# Patient Record
Sex: Female | Born: 1990 | Race: Black or African American | Hispanic: No | Marital: Single | Smoking: Never smoker
Health system: Southern US, Community
[De-identification: ages and names within clinical notes are randomized; demographics above are authoritative.]

---

## 2015-08-16 ENCOUNTER — Ambulatory Visit (INDEPENDENT_AMBULATORY_CARE_PROVIDER_SITE_OTHER): Payer: BLUE CROSS/BLUE SHIELD | Admitting: Family Medicine

## 2015-08-16 ENCOUNTER — Ambulatory Visit (INDEPENDENT_AMBULATORY_CARE_PROVIDER_SITE_OTHER): Payer: BLUE CROSS/BLUE SHIELD

## 2015-08-16 VITALS — BP 124/82 | HR 95 | Temp 98.1°F | Resp 18 | Wt 181.0 lb

## 2015-08-16 DIAGNOSIS — M5441 Lumbago with sciatica, right side: Secondary | ICD-10-CM | POA: Diagnosis not present

## 2015-08-16 DIAGNOSIS — M25551 Pain in right hip: Secondary | ICD-10-CM | POA: Diagnosis not present

## 2015-08-16 MED ORDER — MELOXICAM 7.5 MG PO TABS: 7.5000 mg | ORAL_TABLET | Freq: Every day | ORAL | Status: AC

## 2015-08-16 MED ORDER — CYCLOBENZAPRINE HCL 5 MG PO TABS: ORAL_TABLET | ORAL | Status: AC

## 2015-08-16 NOTE — Progress Notes (Addendum)
Subjective:  By signing my name below, I, Jacqueline Hess, attest that this documentation has been prepared under the direction and in the presence of Jacqueline Staggers, MD.  Jacqueline Hess, Medical Scribe. 08/16/2015.  1:57 PM.   Patient ID: Jacqueline Hess, female    DOB: Mar 29, 1991, 25 y.o.   MRN: [id number]  Chief Complaint  Patient presents with  . Hip Pain    today    HPI HPI Comments: Jacqueline Hess is a 25 y.o. female who presents to [facility] complaining of right hip pain, onset today. Pt states that she woke up with sudden inability tomove the hip without pain.  She indicates that she was unable to bear weight on the area only with assistance. She notes that the pain is present in her hip area and mildly radiates to her right side, and down to her upper outer leg area. Pt notes that last night she went out dancing, and reports being intoxicated, however denies blacking out. She states that she returned home experiencing no symptoms, and she slept in her bed following her regular regimen. Pt was sleeping on her right side during the night. Pt has not taken any medications for the pain, and notes not applying any cold or heat on the area. She denies back pain, radiation of the pain to the genitals, bowel or urinary incontinence, saddle anesthesia, weakness down the legs, fever, night sweats, cold symptoms, or rash.   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 Pt is originally from [place].    There are no active problems to display for this patient.  No past medical history on file. No past surgical history on file. Allergies  Allergen Reactions  . Penicillins    Prior to Admission medications   Not on File   Social History   Social History  . Marital Status: Single    Spouse Name: N/A  . Number of Children: N/A  . Years of Education: N/A   Occupational History  . Not on file.   Social History Main Topics  . Smoking status: Never  Smoker   . Smokeless tobacco: Not on file  . Alcohol Use: No  . Drug Use: No  . Sexual Activity: Not on file   Other Topics Concern  . Not on file   Social History Narrative  . No narrative on file    Review of Systems  Constitutional: Negative for fever and diaphoresis.  Gastrointestinal: Negative for diarrhea.  Genitourinary: Negative for enuresis.  Musculoskeletal: Positive for myalgias and arthralgias. Negative for back pain.  Skin: Negative for rash.  Neurological: Negative for weakness.      Objective:   Physical Exam  Constitutional: She is oriented to person, place, and time. She appears well-developed and well-nourished. No distress.  HENT:  Head: Normocephalic and atraumatic.  Eyes: EOM are normal. Pupils are equal, round, and reactive to light.  Neck: Neck supple.  Cardiovascular: Normal rate.   Pulmonary/Chest: Effort normal.  Musculoskeletal: She exhibits tenderness.  Guarded, with no ability to bear weight on the right leg.Tender along the right sciatic notch. SI and lumbar is non tender. Radiation of pain down the right lateral posterior leg. Pain on the lateral hip and leg with internal rotation of the him. Minimal discomfort with external a rotation of the hip. Negative seated straight leg raise.   Neurological: She is alert and oriented to person, place, and time. No cranial nerve deficit. She displays no  Babinski's sign on the right side. She displays no Babinski's sign on the left side.  Reflex Scores:      Patellar reflexes are 2+ on the right side and 2+ on the left side.      Achilles reflexes are 2+ on the right side and 2+ on the left side. Skin: Skin is warm and dry.  Psychiatric: She has a normal mood and affect. Her behavior is normal.  Nursing note and vitals reviewed.   Filed Vitals:   08/16/15 1316  BP: 124/82  Pulse: 95  Temp: 98.1 F (36.7 C)  TempSrc: Oral  Resp: 18  Weight: 181 lb (82.101 kg)  SpO2: 98%   Dg Hip Unilat W Or W/o  Pelvis 2-3 Views Right  08/16/2015  CLINICAL DATA:  25 year old with acute onset of severe right hip pain when she slowly this morning. No known injury. EXAM: DG HIP (WITH OR WITHOUT PELVIS) 1V RIGHT COMPARISON:  None. FINDINGS: No evidence of acute fracture or dislocation. Well preserved joint space. Well preserved bone mineral density. No intrinsic osseous abnormality. Included AP pelvis demonstrates a normal-appearing contralateral left hip. Sacroiliac joints and symphysis pubis intact. Visualized lower lumbar spine unremarkable. IMPRESSION: Normal examination. Electronically Signed   By: Jacqueline Hess M.D.   On: 08/16/2015 15:21        Assessment & Plan:   Jacqueline Hess is a 25 y.o. female Right hip pain - Plan: DG HIP UNILAT W OR W/O PELVIS 2-3 VIEWS RIGHT, Crutches  Right-sided low back pain with right-sided sciatica - Plan: DG Lumbar Spine 2-3 Views, meloxicam (MOBIC) 7.5 MG tablet, cyclobenzaprine (FLEXERIL) 5 MG tablet, Crutches  No known injury, , possible early sciatica with radiation to hip/leg.    -crutches as difficulty with WB  - mobic, flexeril if needed  - SED  - handout on sciatica treatment, relative rest, but if still unable to WB in 2-3 days, recommend ortho eval - can call to arrange this through [facility].   -RTC/ER precautions.   Meds ordered this encounter  Medications  . meloxicam (MOBIC) 7.5 MG tablet    Sig: Take 1 tablet (7.5 mg total) by mouth daily.    Dispense:  30 tablet    Refill:  0  . cyclobenzaprine (FLEXERIL) 5 MG tablet    Sig: 1 pill by mouth up to every 8 hours as needed. Start with one pill by mouth each bedtime as needed due to sedation    Dispense:  15 tablet    Refill:  0   Patient Instructions  You likely have sciatica or back issue, which can lead to some muscle spasm as well as pain down the leg. Try the mobic each morning (do not combine with other over the counter pain relievers), flexeril at night if needed. Crutches as  needed, if still not able to put weight on your right leg Monday or Tuesday, recommend you follow-up with an orthopedist. Let me know if I need to place a referral.Return to the clinic or go to the nearest emergency room if any of your symptoms worsen or new symptoms occur.  Sciatica With Rehab The sciatic nerve runs from the back down the leg and is responsible for sensation and control of the muscles in the back (posterior) side of the thigh, lower leg, and foot. Sciatica is a condition that is characterized by inflammation of this nerve.  SYMPTOMS   Signs of nerve damage, including numbness and/or weakness along the posterior side of  the lower extremity.  Pain in the back of the thigh that may also travel down the leg.  Pain that worsens when sitting for long periods of time.  Occasionally, pain in the back or buttock. CAUSES  Inflammation of the sciatic nerve is the cause of sciatica. The inflammation is due to something irritating the nerve. Common sources of irritation include: 1. Sitting for long periods of time. 2. Direct trauma to the nerve. 3. Arthritis of the spine. 4. Herniated or ruptured disk. 5. Slipping of the vertebrae (spondylolisthesis). 6. Pressure from soft tissues, such as muscles or ligament-like tissue (fascia). RISK INCREASES WITH: 1. Sports that place pressure or stress on the spine (football or weightlifting). 2. Poor strength and flexibility. 3. Failure to warm up properly before activity. 4. Family history of low back pain or disk disorders. 5. Previous back injury or surgery. 6. Poor body mechanics, especially when lifting, or poor posture. PREVENTION  1. Warm up and stretch properly before activity. 2. Maintain physical fitness: 1. Strength, flexibility, and endurance. 3. Cardiovascular fitness. 4. Learn and use proper technique, especially with posture and lifting. When possible, have coach correct improper technique. 5. Avoid activities that place  stress on the spine. PROGNOSIS If treated properly, then sciatica usually resolves within 6 weeks. However, occasionally surgery is necessary.  RELATED COMPLICATIONS  1. Permanent nerve damage, including pain, numbness, tingle, or weakness. 2. Chronic back pain. 3. Risks of surgery: infection, bleeding, nerve damage, or damage to surrounding tissues. TREATMENT Treatment initially involves resting from any activities that aggravate your symptoms. The use of ice and medication may help reduce pain and inflammation. The use of strengthening and stretching exercises may help reduce pain with activity. These exercises may be performed at home or with referral to a therapist. A therapist may recommend further treatments, such as transcutaneous electronic nerve stimulation (TENS) or ultrasound. Your caregiver may recommend corticosteroid injections to help reduce inflammation of the sciatic nerve. If symptoms persist despite non-surgical (conservative) treatment, then surgery may be recommended. MEDICATION 1. If pain medication is necessary, then nonsteroidal anti-inflammatory medications, such as aspirin and ibuprofen, or other minor pain relievers, such as acetaminophen, are often recommended. 2. Do not take pain medication for 7 days before surgery. 3. Prescription pain relievers may be given if deemed necessary by your caregiver. Use only as directed and only as much as you need. 4. Ointments applied to the skin may be helpful. 5. Corticosteroid injections may be given by your caregiver. These injections should be reserved for the most serious cases, because they may only be given a certain number of times. HEAT AND COLD 1. Cold treatment (icing) relieves pain and reduces inflammation. Cold treatment should be applied for 10 to 15 minutes every 2 to 3 hours for inflammation and pain and immediately after any activity that aggravates your symptoms. Use ice packs or massage the area with a piece of ice  (ice massage). 2. Heat treatment may be used prior to performing the stretching and strengthening activities prescribed by your caregiver, physical therapist, or athletic trainer. Use a heat pack or soak the injury in warm water. SEEK MEDICAL CARE IF: 1. Treatment seems to offer no benefit, or the condition worsens. 2. Any medications produce adverse side effects. EXERCISES  RANGE OF MOTION (ROM) AND STRETCHING EXERCISES - Sciatica Most people with sciatic will find that their symptoms worsen with either excessive bending forward (flexion) or arching at the low back (extension). The exercises which will help resolve your  symptoms will focus on the opposite motion. Your physician, physical therapist or athletic trainer will help you determine which exercises will be most helpful to resolve your low back pain. Do not complete any exercises without first consulting with your clinician. Discontinue any exercises which worsen your symptoms until you speak to your clinician. If you have pain, numbness or tingling which travels down into your buttocks, leg or foot, the goal of the therapy is for these symptoms to move closer to your back and eventually resolve. Occasionally, these leg symptoms will get better, but your low back pain may worsen; this is typically an indication of progress in your rehabilitation. Be certain to be very alert to any changes in your symptoms and the activities in which you participated in the 24 hours prior to the change. Sharing this information with your clinician will allow him/her to most efficiently treat your condition. These exercises may help you when beginning to rehabilitate your injury. Your symptoms may resolve with or without further involvement from your physician, physical therapist or athletic trainer. While completing these exercises, remember:   Restoring tissue flexibility helps normal motion to return to the joints. This allows healthier, less painful movement and  activity.  An effective stretch should be held for at least 30 seconds.  A stretch should never be painful. You should only feel a gentle lengthening or release in the stretched tissue. FLEXION RANGE OF MOTION AND STRETCHING EXERCISES: STRETCH - Flexion, Single Knee to Chest   Lie on a firm bed or floor with both legs extended in front of you.  Keeping one leg in contact with the floor, bring your opposite knee to your chest. Hold your leg in place by either grabbing behind your thigh or at your knee.  Pull until you feel a gentle stretch in your low back. Hold __________ seconds.  Slowly release your grasp and repeat the exercise with the opposite side. Repeat __________ times. Complete this exercise __________ times per day.  STRETCH - Flexion, Double Knee to Chest  Lie on a firm bed or floor with both legs extended in front of you.  Keeping one leg in contact with the floor, bring your opposite knee to your chest.  Tense your stomach muscles to support your back and then lift your other knee to your chest. Hold your legs in place by either grabbing behind your thighs or at your knees.  Pull both knees toward your chest until you feel a gentle stretch in your low back. Hold __________ seconds.  Tense your stomach muscles and slowly return one leg at a time to the floor. Repeat __________ times. Complete this exercise __________ times per day.  STRETCH - Low Trunk Rotation   Lie on a firm bed or floor. Keeping your legs in front of you, bend your knees so they are both pointed toward the ceiling and your feet are flat on the floor.  Extend your arms out to the side. This will stabilize your upper body by keeping your shoulders in contact with the floor.  Gently and slowly drop both knees together to one side until you feel a gentle stretch in your low back. Hold for __________ seconds.  Tense your stomach muscles to support your low back as you bring your knees back to the  starting position. Repeat the exercise to the other side. Repeat __________ times. Complete this exercise __________ times per day  EXTENSION RANGE OF MOTION AND FLEXIBILITY EXERCISES: STRETCH - Extension, Prone on  Elbows  Lie on your stomach on the floor, a bed will be too soft. Place your palms about shoulder width apart and at the height of your head.  Place your elbows under your shoulders. If this is too painful, stack pillows under your chest.  Allow your body to relax so that your hips drop lower and make contact more completely with the floor.  Hold this position for __________ seconds.  Slowly return to lying flat on the floor. Repeat __________ times. Complete this exercise __________ times per day.  RANGE OF MOTION - Extension, Prone Press Ups  Lie on your stomach on the floor, a bed will be too soft. Place your palms about shoulder width apart and at the height of your head.  Keeping your back as relaxed as possible, slowly straighten your elbows while keeping your hips on the floor. You may adjust the placement of your hands to maximize your comfort. As you gain motion, your hands will come more underneath your shoulders.  Hold this position __________ seconds.  Slowly return to lying flat on the floor. Repeat __________ times. Complete this exercise __________ times per day.  STRENGTHENING EXERCISES - Sciatica  These exercises may help you when beginning to rehabilitate your injury. These exercises should be done near your "sweet spot." This is the neutral, low-back arch, somewhere between fully rounded and fully arched, that is your least painful position. When performed in this safe range of motion, these exercises can be used for people who have either a flexion or extension based injury. These exercises may resolve your symptoms with or without further involvement from your physician, physical therapist or athletic trainer. While completing these exercises, remember:    Muscles can gain both the endurance and the strength needed for everyday activities through controlled exercises.  Complete these exercises as instructed by your physician, physical therapist or athletic trainer. Progress with the resistance and repetition exercises only as your caregiver advises.  You may experience muscle soreness or fatigue, but the pain or discomfort you are trying to eliminate should never worsen during these exercises. If this pain does worsen, stop and make certain you are following the directions exactly. If the pain is still present after adjustments, discontinue the exercise until you can discuss the trouble with your clinician. STRENGTHENING - Deep Abdominals, Pelvic Tilt   Lie on a firm bed or floor. Keeping your legs in front of you, bend your knees so they are both pointed toward the ceiling and your feet are flat on the floor.  Tense your lower abdominal muscles to press your low back into the floor. This motion will rotate your pelvis so that your tail bone is scooping upwards rather than pointing at your feet or into the floor.  With a gentle tension and even breathing, hold this position for __________ seconds. Repeat __________ times. Complete this exercise __________ times per day.  STRENGTHENING - Abdominals, Crunches   Lie on a firm bed or floor. Keeping your legs in front of you, bend your knees so they are both pointed toward the ceiling and your feet are flat on the floor. Cross your arms over your chest.  Slightly tip your chin down without bending your neck.  Tense your abdominals and slowly lift your trunk high enough to just clear your shoulder blades. Lifting higher can put excessive stress on the low back and does not further strengthen your abdominal muscles.  Control your return to the starting position. Repeat __________ times. Complete  this exercise __________ times per day.  STRENGTHENING - Quadruped, Opposite UE/LE Lift  Assume a  hands and knees position on a firm surface. Keep your hands under your shoulders and your knees under your hips. You may place padding under your knees for comfort.  Find your neutral spine and gently tense your abdominal muscles so that you can maintain this position. Your shoulders and hips should form a rectangle that is parallel with the floor and is not twisted.  Keeping your trunk steady, lift your right hand no higher than your shoulder and then your left leg no higher than your hip. Make sure you are not holding your breath. Hold this position __________ seconds.  Continuing to keep your abdominal muscles tense and your back steady, slowly return to your starting position. Repeat with the opposite arm and leg. Repeat __________ times. Complete this exercise __________ times per day.  STRENGTHENING - Abdominals and Quadriceps, Straight Leg Raise   Lie on a firm bed or floor with both legs extended in front of you.  Keeping one leg in contact with the floor, bend the other knee so that your foot can rest flat on the floor.  Find your neutral spine, and tense your abdominal muscles to maintain your spinal position throughout the exercise.  Slowly lift your straight leg off the floor about 6 inches for a count of 15, making sure to not hold your breath.  Still keeping your neutral spine, slowly lower your leg all the way to the floor. Repeat this exercise with each leg __________ times. Complete this exercise __________ times per day. POSTURE AND BODY MECHANICS CONSIDERATIONS - Sciatica Keeping correct posture when sitting, standing or completing your activities will reduce the stress put on different body tissues, allowing injured tissues a chance to heal and limiting painful experiences. The following are general guidelines for improved posture. Your physician or physical therapist will provide you with any instructions specific to your needs. While reading these guidelines,  remember:  The exercises prescribed by your provider will help you have the flexibility and strength to maintain correct postures.  The correct posture provides the optimal environment for your joints to work. All of your joints have less wear and tear when properly supported by a spine with good posture. This means you will experience a healthier, less painful body.  Correct posture must be practiced with all of your activities, especially prolonged sitting and standing. Correct posture is as important when doing repetitive low-stress activities (typing) as it is when doing a single heavy-load activity (lifting). RESTING POSITIONS Consider which positions are most painful for you when choosing a resting position. If you have pain with flexion-based activities (sitting, bending, stooping, squatting), choose a position that allows you to rest in a less flexed posture. You would want to avoid curling into a fetal position on your side. If your pain worsens with extension-based activities (prolonged standing, working overhead), avoid resting in an extended position such as sleeping on your stomach. Most people will find more comfort when they rest with their spine in a more neutral position, neither too rounded nor too arched. Lying on a non-sagging bed on your side with a pillow between your knees, or on your back with a pillow under your knees will often provide some relief. Keep in mind, being in any one position for a prolonged period of time, no matter how correct your posture, can still lead to stiffness. PROPER SITTING POSTURE In order to minimize stress and  discomfort on your spine, you must sit with correct posture Sitting with good posture should be effortless for a healthy body. Returning to good posture is a gradual process. Many people can work toward this most comfortably by using various supports until they have the flexibility and strength to maintain this posture on their own. When sitting  with proper posture, your ears will fall over your shoulders and your shoulders will fall over your hips. You should use the back of the chair to support your upper back. Your low back will be in a neutral position, just slightly arched. You may place a small pillow or folded towel at the base of your low back for support.  When working at a desk, create an environment that supports good, upright posture. Without extra support, muscles fatigue and lead to excessive strain on joints and other tissues. Keep these recommendations in mind: CHAIR:   A chair should be able to slide under your desk when your back makes contact with the back of the chair. This allows you to work closely.  The chair's height should allow your eyes to be level with the upper part of your monitor and your hands to be slightly lower than your elbows. BODY POSITION  Your feet should make contact with the floor. If this is not possible, use a foot rest.  Keep your ears over your shoulders. This will reduce stress on your neck and low back. INCORRECT SITTING POSTURES   If you are feeling tired and unable to assume a healthy sitting posture, do not slouch or slump. This puts excessive strain on your back tissues, causing more damage and pain. Healthier options include:  Using more support, like a lumbar pillow.  Switching tasks to something that requires you to be upright or walking.  Talking a brief walk.  Lying down to rest in a neutral-spine position. PROLONGED STANDING WHILE SLIGHTLY LEANING FORWARD  When completing a task that requires you to lean forward while standing in one place for a long time, place either foot up on a stationary 2-4 inch high object to help maintain the best posture. When both feet are on the ground, the low back tends to lose its slight inward curve. If this curve flattens (or becomes too large), then the back and your other joints will experience too much stress, fatigue more quickly and can  cause pain.  CORRECT STANDING POSTURES Proper standing posture should be assumed with all daily activities, even if they only take a few moments, like when brushing your teeth. As in sitting, your ears should fall over your shoulders and your shoulders should fall over your hips. You should keep a slight tension in your abdominal muscles to brace your spine. Your tailbone should point down to the ground, not behind your body, resulting in an over-extended swayback posture.  INCORRECT STANDING POSTURES  Common incorrect standing postures include a forward head, locked knees and/or an excessive swayback. WALKING Walk with an upright posture. Your ears, shoulders and hips should all line-up. PROLONGED ACTIVITY IN A FLEXED POSITION When completing a task that requires you to bend forward at your waist or lean over a low surface, try to find a way to stabilize 3 of 4 of your limbs. You can place a hand or elbow on your thigh or rest a knee on the surface you are reaching across. This will provide you more stability so that your muscles do not fatigue as quickly. By keeping your knees relaxed, or  slightly bent, you will also reduce stress across your low back. CORRECT LIFTING TECHNIQUES DO :   Assume a wide stance. This will provide you more stability and the opportunity to get as close as possible to the object which you are lifting.  Tense your abdominals to brace your spine; then bend at the knees and hips. Keeping your back locked in a neutral-spine position, lift using your leg muscles. Lift with your legs, keeping your back straight.  Test the weight of unknown objects before attempting to lift them.  Try to keep your elbows locked down at your sides in order get the best strength from your shoulders when carrying an object.  Always ask for help when lifting heavy or awkward objects. INCORRECT LIFTING TECHNIQUES DO NOT:   Lock your knees when lifting, even if it is a small object.  Bend and  twist. Pivot at your feet or move your feet when needing to change directions.  Assume that you cannot safely pick up a paperclip without proper posture.   This information is not intended to replace advice given to you by your health care provider. Make sure you discuss any questions you have with your health care provider.   Document Released: 06/21/2005 Document Revised: 11/05/2014 Document Reviewed: 10/03/2008 [employer] Interactive Patient Education [employer] Because you received an x-ray today, you will receive an invoice from [facility]. Please contact [facility] at [phone] with questions or concerns regarding your invoice. Our billing staff will not be able to assist you with those questions.  Crutch Use Crutches are used to take weight off one of your legs or feet when you stand or walk. It is important to use crutches that fit properly. When fitted properly:  Each crutch should be 2-3 finger widths below the armpit.  Your weight should be supported by your hand, and not by resting the armpit on the crutch. RISKS AND COMPLICATIONS Damage to the nerves that extend from your armpit to your hand and arm. To prevent this from happening, make sure your crutches fit properly and do not put pressure on your armpit when using them. HOW TO USE YOUR CRUTCHES If you have been instructed to use partial weight bearing, apply (bear) the amount of weight as your health care provider suggests. Do not bear weight in an amount that causes pain to the area of injury. Walking 7. Step with the crutches. 8. Swing the healthy leg slightly ahead of the crutches. Going Up Steps If there is no handrail: 7. Step up with the healthy leg. 8. Step up with the crutches and injured leg. 9. Continue in this way. If there is a handrail: 6. Hold both crutches in one hand. 7. Place your free hand on the handrail. 8. While putting your weight on your arms, lift your healthy leg to  the step. 9. Bring the crutches and the injured leg up to that step. 10. Continue in this way. Going Down Steps Be very careful, as going down stairs with crutches is very challenging. If there is no handrail: 4. Step down with the injured leg and crutches. 5. Step down with the healthy leg. If there is a handrail: 6. Place your hand on the handrail. 7. Hold both crutches with your free hand. 8. Lower your injured leg and crutch to the step below you. Make sure to keep the crutch tips in the center of the step, never on the edge. 9. Lower your healthy leg to that step.  10. Continue in this way. Standing Up 3. Hold the injured leg forward. 4. Grab the armrest with one hand and the top of the crutches with the other hand. 5. Using these supports, pull yourself up to a standing position. Sitting Down 3. Hold the injured leg forward. 4. Grab the armrest with one hand and the top of the crutches with the other hand. 5. Lower yourself to a sitting position. SEEK MEDICAL CARE IF:  You still feel unsteady on your feet.  You develop new pain, for example in your armpits, back, shoulder, wrist, or hip.  You develop any numbness or tingling. SEEK IMMEDIATE MEDICAL CARE IF:  You fall.   This information is not intended to replace advice given to you by your health care provider. Make sure you discuss any questions you have with your health care provider.   Document Released: 06/18/2000 Document Revised: 07/12/2014 Document Reviewed: 02/26/2013 [employer] Interactive Patient Education [employer]       I personally performed the services described in this documentation, which was scribed in my presence. The recorded information has been reviewed and considered, and addended by me as needed.

## 2015-08-16 NOTE — Patient Instructions (Addendum)
You likely have sciatica or back issue, which can lead to some muscle spasm as well as pain down the leg. Try the mobic each morning (do not combine with other over the counter pain relievers), flexeril at night if needed. Crutches as needed, if still not able to put weight on your right leg Monday or Tuesday, recommend you follow-up with an orthopedist. Let me know if I need to place a referral.Return to the clinic or go to the nearest emergency room if any of your symptoms worsen or new symptoms occur.  Sciatica With Rehab The sciatic nerve runs from the back down the leg and is responsible for sensation and control of the muscles in the back (posterior) side of the thigh, lower leg, and foot. Sciatica is a condition that is characterized by inflammation of this nerve.  SYMPTOMS   Signs of nerve damage, including numbness and/or weakness along the posterior side of the lower extremity.  Pain in the back of the thigh that may also travel down the leg.  Pain that worsens when sitting for long periods of time.  Occasionally, pain in the back or buttock. CAUSES  Inflammation of the sciatic nerve is the cause of sciatica. The inflammation is due to something irritating the nerve. Common sources of irritation include: 1. Sitting for long periods of time. 2. Direct trauma to the nerve. 3. Arthritis of the spine. 4. Herniated or ruptured disk. 5. Slipping of the vertebrae (spondylolisthesis). 6. Pressure from soft tissues, such as muscles or ligament-like tissue (fascia). RISK INCREASES WITH: 1. Sports that place pressure or stress on the spine (football or weightlifting). 2. Poor strength and flexibility. 3. Failure to warm up properly before activity. 4. Family history of low back pain or disk disorders. 5. Previous back injury or surgery. 6. Poor body mechanics, especially when lifting, or poor posture. PREVENTION  1. Warm up and stretch properly before activity. 2. Maintain physical  fitness: 1. Strength, flexibility, and endurance. 3. Cardiovascular fitness. 4. Learn and use proper technique, especially with posture and lifting. When possible, have coach correct improper technique. 5. Avoid activities that place stress on the spine. PROGNOSIS If treated properly, then sciatica usually resolves within 6 weeks. However, occasionally surgery is necessary.  RELATED COMPLICATIONS  1. Permanent nerve damage, including pain, numbness, tingle, or weakness. 2. Chronic back pain. 3. Risks of surgery: infection, bleeding, nerve damage, or damage to surrounding tissues. TREATMENT Treatment initially involves resting from any activities that aggravate your symptoms. The use of ice and medication may help reduce pain and inflammation. The use of strengthening and stretching exercises may help reduce pain with activity. These exercises may be performed at home or with referral to a therapist. A therapist may recommend further treatments, such as transcutaneous electronic nerve stimulation (TENS) or ultrasound. Your caregiver may recommend corticosteroid injections to help reduce inflammation of the sciatic nerve. If symptoms persist despite non-surgical (conservative) treatment, then surgery may be recommended. MEDICATION 1. If pain medication is necessary, then nonsteroidal anti-inflammatory medications, such as aspirin and ibuprofen, or other minor pain relievers, such as acetaminophen, are often recommended. 2. Do not take pain medication for 7 days before surgery. 3. Prescription pain relievers may be given if deemed necessary by your caregiver. Use only as directed and only as much as you need. 4. Ointments applied to the skin may be helpful. 5. Corticosteroid injections may be given by your caregiver. These injections should be reserved for the most serious cases, because they may only  be given a certain number of times. HEAT AND COLD 1. Cold treatment (icing) relieves pain and  reduces inflammation. Cold treatment should be applied for 10 to 15 minutes every 2 to 3 hours for inflammation and pain and immediately after any activity that aggravates your symptoms. Use ice packs or massage the area with a piece of ice (ice massage). 2. Heat treatment may be used prior to performing the stretching and strengthening activities prescribed by your caregiver, physical therapist, or athletic trainer. Use a heat pack or soak the injury in warm water. SEEK MEDICAL CARE IF: 1. Treatment seems to offer no benefit, or the condition worsens. 2. Any medications produce adverse side effects. EXERCISES  RANGE OF MOTION (ROM) AND STRETCHING EXERCISES - Sciatica Most people with sciatic will find that their symptoms worsen with either excessive bending forward (flexion) or arching at the low back (extension). The exercises which will help resolve your symptoms will focus on the opposite motion. Your physician, physical therapist or athletic trainer will help you determine which exercises will be most helpful to resolve your low back pain. Do not complete any exercises without first consulting with your clinician. Discontinue any exercises which worsen your symptoms until you speak to your clinician. If you have pain, numbness or tingling which travels down into your buttocks, leg or foot, the goal of the therapy is for these symptoms to move closer to your back and eventually resolve. Occasionally, these leg symptoms will get better, but your low back pain may worsen; this is typically an indication of progress in your rehabilitation. Be certain to be very alert to any changes in your symptoms and the activities in which you participated in the 24 hours prior to the change. Sharing this information with your clinician will allow him/her to most efficiently treat your condition. These exercises may help you when beginning to rehabilitate your injury. Your symptoms may resolve with or without further  involvement from your physician, physical therapist or athletic trainer. While completing these exercises, remember:   Restoring tissue flexibility helps normal motion to return to the joints. This allows healthier, less painful movement and activity.  An effective stretch should be held for at least 30 seconds.  A stretch should never be painful. You should only feel a gentle lengthening or release in the stretched tissue. FLEXION RANGE OF MOTION AND STRETCHING EXERCISES: STRETCH - Flexion, Single Knee to Chest   Lie on a firm bed or floor with both legs extended in front of you.  Keeping one leg in contact with the floor, bring your opposite knee to your chest. Hold your leg in place by either grabbing behind your thigh or at your knee.  Pull until you feel a gentle stretch in your low back. Hold __________ seconds.  Slowly release your grasp and repeat the exercise with the opposite side. Repeat __________ times. Complete this exercise __________ times per day.  STRETCH - Flexion, Double Knee to Chest  Lie on a firm bed or floor with both legs extended in front of you.  Keeping one leg in contact with the floor, bring your opposite knee to your chest.  Tense your stomach muscles to support your back and then lift your other knee to your chest. Hold your legs in place by either grabbing behind your thighs or at your knees.  Pull both knees toward your chest until you feel a gentle stretch in your low back. Hold __________ seconds.  Tense your stomach muscles and slowly  return one leg at a time to the floor. Repeat __________ times. Complete this exercise __________ times per day.  STRETCH - Low Trunk Rotation   Lie on a firm bed or floor. Keeping your legs in front of you, bend your knees so they are both pointed toward the ceiling and your feet are flat on the floor.  Extend your arms out to the side. This will stabilize your upper body by keeping your shoulders in contact with  the floor.  Gently and slowly drop both knees together to one side until you feel a gentle stretch in your low back. Hold for __________ seconds.  Tense your stomach muscles to support your low back as you bring your knees back to the starting position. Repeat the exercise to the other side. Repeat __________ times. Complete this exercise __________ times per day  EXTENSION RANGE OF MOTION AND FLEXIBILITY EXERCISES: STRETCH - Extension, Prone on Elbows  Lie on your stomach on the floor, a bed will be too soft. Place your palms about shoulder width apart and at the height of your head.  Place your elbows under your shoulders. If this is too painful, stack pillows under your chest.  Allow your body to relax so that your hips drop lower and make contact more completely with the floor.  Hold this position for __________ seconds.  Slowly return to lying flat on the floor. Repeat __________ times. Complete this exercise __________ times per day.  RANGE OF MOTION - Extension, Prone Press Ups  Lie on your stomach on the floor, a bed will be too soft. Place your palms about shoulder width apart and at the height of your head.  Keeping your back as relaxed as possible, slowly straighten your elbows while keeping your hips on the floor. You may adjust the placement of your hands to maximize your comfort. As you gain motion, your hands will come more underneath your shoulders.  Hold this position __________ seconds.  Slowly return to lying flat on the floor. Repeat __________ times. Complete this exercise __________ times per day.  STRENGTHENING EXERCISES - Sciatica  These exercises may help you when beginning to rehabilitate your injury. These exercises should be done near your "sweet spot." This is the neutral, low-back arch, somewhere between fully rounded and fully arched, that is your least painful position. When performed in this safe range of motion, these exercises can be used for people  who have either a flexion or extension based injury. These exercises may resolve your symptoms with or without further involvement from your physician, physical therapist or athletic trainer. While completing these exercises, remember:   Muscles can gain both the endurance and the strength needed for everyday activities through controlled exercises.  Complete these exercises as instructed by your physician, physical therapist or athletic trainer. Progress with the resistance and repetition exercises only as your caregiver advises.  You may experience muscle soreness or fatigue, but the pain or discomfort you are trying to eliminate should never worsen during these exercises. If this pain does worsen, stop and make certain you are following the directions exactly. If the pain is still present after adjustments, discontinue the exercise until you can discuss the trouble with your clinician. STRENGTHENING - Deep Abdominals, Pelvic Tilt   Lie on a firm bed or floor. Keeping your legs in front of you, bend your knees so they are both pointed toward the ceiling and your feet are flat on the floor.  Tense your lower abdominal muscles  to press your low back into the floor. This motion will rotate your pelvis so that your tail bone is scooping upwards rather than pointing at your feet or into the floor.  With a gentle tension and even breathing, hold this position for __________ seconds. Repeat __________ times. Complete this exercise __________ times per day.  STRENGTHENING - Abdominals, Crunches   Lie on a firm bed or floor. Keeping your legs in front of you, bend your knees so they are both pointed toward the ceiling and your feet are flat on the floor. Cross your arms over your chest.  Slightly tip your chin down without bending your neck.  Tense your abdominals and slowly lift your trunk high enough to just clear your shoulder blades. Lifting higher can put excessive stress on the low back and does  not further strengthen your abdominal muscles.  Control your return to the starting position. Repeat __________ times. Complete this exercise __________ times per day.  STRENGTHENING - Quadruped, Opposite UE/LE Lift  Assume a hands and knees position on a firm surface. Keep your hands under your shoulders and your knees under your hips. You may place padding under your knees for comfort.  Find your neutral spine and gently tense your abdominal muscles so that you can maintain this position. Your shoulders and hips should form a rectangle that is parallel with the floor and is not twisted.  Keeping your trunk steady, lift your right hand no higher than your shoulder and then your left leg no higher than your hip. Make sure you are not holding your breath. Hold this position __________ seconds.  Continuing to keep your abdominal muscles tense and your back steady, slowly return to your starting position. Repeat with the opposite arm and leg. Repeat __________ times. Complete this exercise __________ times per day.  STRENGTHENING - Abdominals and Quadriceps, Straight Leg Raise   Lie on a firm bed or floor with both legs extended in front of you.  Keeping one leg in contact with the floor, bend the other knee so that your foot can rest flat on the floor.  Find your neutral spine, and tense your abdominal muscles to maintain your spinal position throughout the exercise.  Slowly lift your straight leg off the floor about 6 inches for a count of 15, making sure to not hold your breath.  Still keeping your neutral spine, slowly lower your leg all the way to the floor. Repeat this exercise with each leg __________ times. Complete this exercise __________ times per day. POSTURE AND BODY MECHANICS CONSIDERATIONS - Sciatica Keeping correct posture when sitting, standing or completing your activities will reduce the stress put on different body tissues, allowing injured tissues a chance to heal and  limiting painful experiences. The following are general guidelines for improved posture. Your physician or physical therapist will provide you with any instructions specific to your needs. While reading these guidelines, remember:  The exercises prescribed by your provider will help you have the flexibility and strength to maintain correct postures.  The correct posture provides the optimal environment for your joints to work. All of your joints have less wear and tear when properly supported by a spine with good posture. This means you will experience a healthier, less painful body.  Correct posture must be practiced with all of your activities, especially prolonged sitting and standing. Correct posture is as important when doing repetitive low-stress activities (typing) as it is when doing a single heavy-load activity (lifting). RESTING POSITIONS  Consider which positions are most painful for you when choosing a resting position. If you have pain with flexion-based activities (sitting, bending, stooping, squatting), choose a position that allows you to rest in a less flexed posture. You would want to avoid curling into a fetal position on your side. If your pain worsens with extension-based activities (prolonged standing, working overhead), avoid resting in an extended position such as sleeping on your stomach. Most people will find more comfort when they rest with their spine in a more neutral position, neither too rounded nor too arched. Lying on a non-sagging bed on your side with a pillow between your knees, or on your back with a pillow under your knees will often provide some relief. Keep in mind, being in any one position for a prolonged period of time, no matter how correct your posture, can still lead to stiffness. PROPER SITTING POSTURE In order to minimize stress and discomfort on your spine, you must sit with correct posture Sitting with good posture should be effortless for a healthy body.  Returning to good posture is a gradual process. Many people can work toward this most comfortably by using various supports until they have the flexibility and strength to maintain this posture on their own. When sitting with proper posture, your ears will fall over your shoulders and your shoulders will fall over your hips. You should use the back of the chair to support your upper back. Your low back will be in a neutral position, just slightly arched. You may place a small pillow or folded towel at the base of your low back for support.  When working at a desk, create an environment that supports good, upright posture. Without extra support, muscles fatigue and lead to excessive strain on joints and other tissues. Keep these recommendations in mind: CHAIR:   A chair should be able to slide under your desk when your back makes contact with the back of the chair. This allows you to work closely.  The chair's height should allow your eyes to be level with the upper part of your monitor and your hands to be slightly lower than your elbows. BODY POSITION  Your feet should make contact with the floor. If this is not possible, use a foot rest.  Keep your ears over your shoulders. This will reduce stress on your neck and low back. INCORRECT SITTING POSTURES   If you are feeling tired and unable to assume a healthy sitting posture, do not slouch or slump. This puts excessive strain on your back tissues, causing more damage and pain. Healthier options include:  Using more support, like a lumbar pillow.  Switching tasks to something that requires you to be upright or walking.  Talking a brief walk.  Lying down to rest in a neutral-spine position. PROLONGED STANDING WHILE SLIGHTLY LEANING FORWARD  When completing a task that requires you to lean forward while standing in one place for a long time, place either foot up on a stationary 2-4 inch high object to help maintain the best posture. When both  feet are on the ground, the low back tends to lose its slight inward curve. If this curve flattens (or becomes too large), then the back and your other joints will experience too much stress, fatigue more quickly and can cause pain.  CORRECT STANDING POSTURES Proper standing posture should be assumed with all daily activities, even if they only take a few moments, like when brushing your teeth. As in sitting, your  ears should fall over your shoulders and your shoulders should fall over your hips. You should keep a slight tension in your abdominal muscles to brace your spine. Your tailbone should point down to the ground, not behind your body, resulting in an over-extended swayback posture.  INCORRECT STANDING POSTURES  Common incorrect standing postures include a forward head, locked knees and/or an excessive swayback. WALKING Walk with an upright posture. Your ears, shoulders and hips should all line-up. PROLONGED ACTIVITY IN A FLEXED POSITION When completing a task that requires you to bend forward at your waist or lean over a low surface, try to find a way to stabilize 3 of 4 of your limbs. You can place a hand or elbow on your thigh or rest a knee on the surface you are reaching across. This will provide you more stability so that your muscles do not fatigue as quickly. By keeping your knees relaxed, or slightly bent, you will also reduce stress across your low back. CORRECT LIFTING TECHNIQUES DO :   Assume a wide stance. This will provide you more stability and the opportunity to get as close as possible to the object which you are lifting.  Tense your abdominals to brace your spine; then bend at the knees and hips. Keeping your back locked in a neutral-spine position, lift using your leg muscles. Lift with your legs, keeping your back straight.  Test the weight of unknown objects before attempting to lift them.  Try to keep your elbows locked down at your sides in order get the best strength  from your shoulders when carrying an object.  Always ask for help when lifting heavy or awkward objects. INCORRECT LIFTING TECHNIQUES DO NOT:   Lock your knees when lifting, even if it is a small object.  Bend and twist. Pivot at your feet or move your feet when needing to change directions.  Assume that you cannot safely pick up a paperclip without proper posture.   This information is not intended to replace advice given to you by your health care provider. Make sure you discuss any questions you have with your health care provider.   Document Released: 06/21/2005 Document Revised: 11/05/2014 Document Reviewed: 10/03/2008 [employer] Interactive Patient Education [employer] Because you received an x-ray today, you will receive an invoice from [facility]. Please contact [facility] at [phone] with questions or concerns regarding your invoice. Our billing staff will not be able to assist you with those questions.  Crutch Use Crutches are used to take weight off one of your legs or feet when you stand or walk. It is important to use crutches that fit properly. When fitted properly:  Each crutch should be 2-3 finger widths below the armpit.  Your weight should be supported by your hand, and not by resting the armpit on the crutch. RISKS AND COMPLICATIONS Damage to the nerves that extend from your armpit to your hand and arm. To prevent this from happening, make sure your crutches fit properly and do not put pressure on your armpit when using them. HOW TO USE YOUR CRUTCHES If you have been instructed to use partial weight bearing, apply (bear) the amount of weight as your health care provider suggests. Do not bear weight in an amount that causes pain to the area of injury. Walking 7. Step with the crutches. 8. Swing the healthy leg slightly ahead of the crutches. Going Up Steps If there is no handrail: 7. Step up with the healthy leg. 8.  Step up with the  crutches and injured leg. 9. Continue in this way. If there is a handrail: 6. Hold both crutches in one hand. 7. Place your free hand on the handrail. 8. While putting your weight on your arms, lift your healthy leg to the step. 9. Bring the crutches and the injured leg up to that step. 10. Continue in this way. Going Down Steps Be very careful, as going down stairs with crutches is very challenging. If there is no handrail: 4. Step down with the injured leg and crutches. 5. Step down with the healthy leg. If there is a handrail: 6. Place your hand on the handrail. 7. Hold both crutches with your free hand. 8. Lower your injured leg and crutch to the step below you. Make sure to keep the crutch tips in the center of the step, never on the edge. 9. Lower your healthy leg to that step. 10. Continue in this way. Standing Up 3. Hold the injured leg forward. 4. Grab the armrest with one hand and the top of the crutches with the other hand. 5. Using these supports, pull yourself up to a standing position. Sitting Down 3. Hold the injured leg forward. 4. Grab the armrest with one hand and the top of the crutches with the other hand. 5. Lower yourself to a sitting position. SEEK MEDICAL CARE IF:  You still feel unsteady on your feet.  You develop new pain, for example in your armpits, back, shoulder, wrist, or hip.  You develop any numbness or tingling. SEEK IMMEDIATE MEDICAL CARE IF:  You fall.   This information is not intended to replace advice given to you by your health care provider. Make sure you discuss any questions you have with your health care provider.   Document Released: 06/18/2000 Document Revised: 07/12/2014 Document Reviewed: 02/26/2013 [employer] Interactive Patient Education [employer]

## 2016-12-02 IMAGING — CR DG HIP (WITH OR WITHOUT PELVIS) 2-3V*R*
2 series · 2 of 2 positions shown · non-contrast
Comparison: None.

CLINICAL DATA: 24-year-old with acute onset of severe right hip
pain when she slowly this morning. No known injury.

EXAM:
DG HIP (WITH OR WITHOUT PELVIS) 1V RIGHT

[lateral]
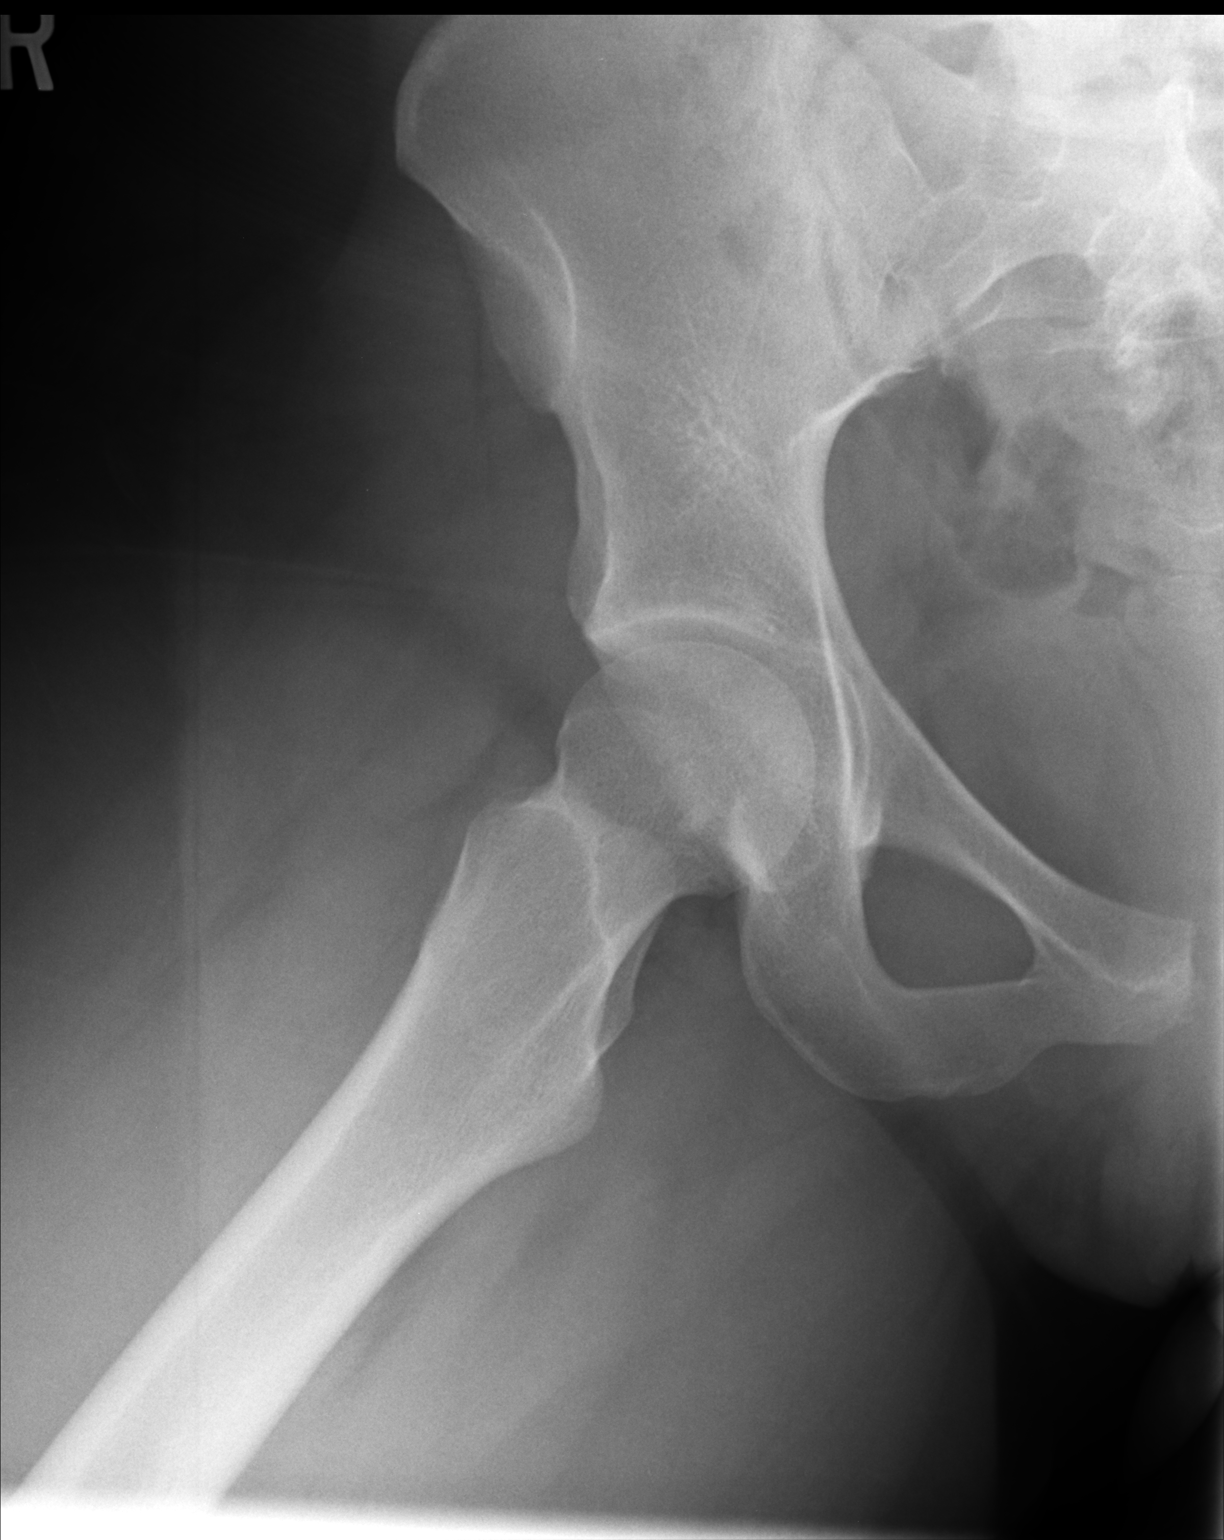

[AP]
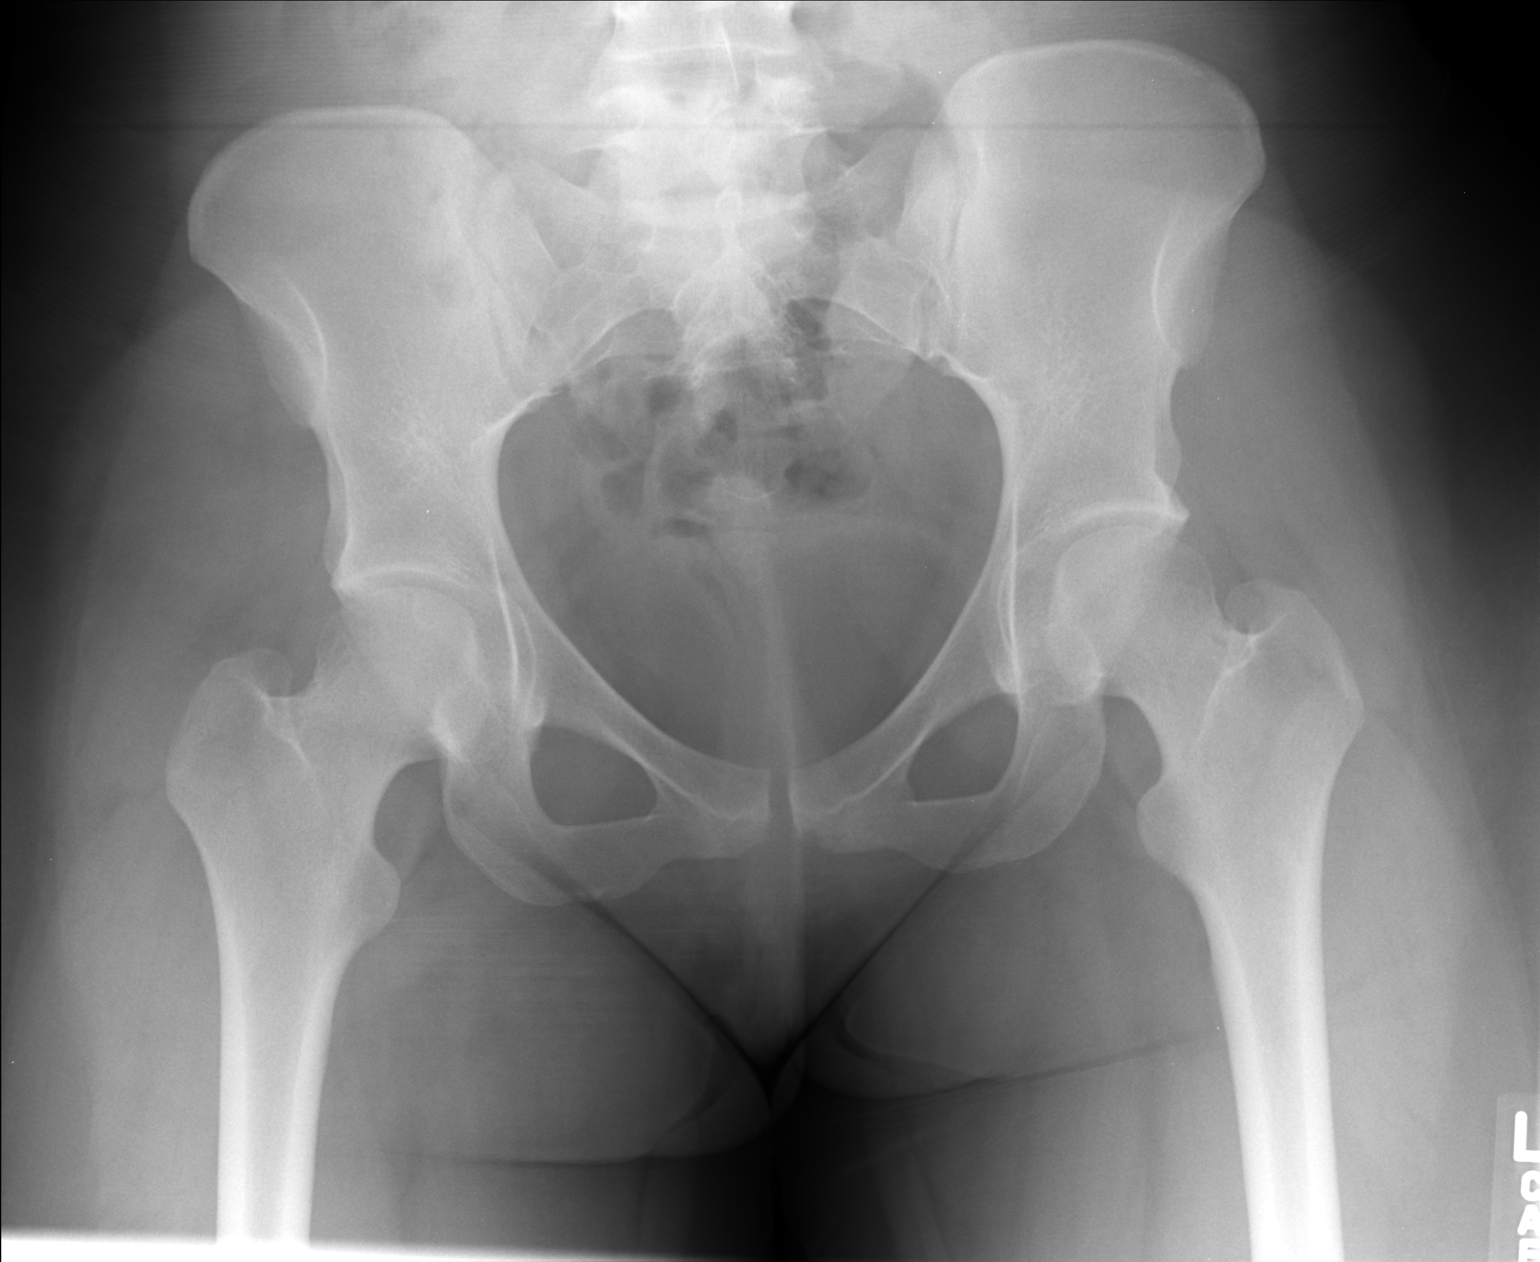

[2 of 2 positions shown; findings below may reference images not displayed]

FINDINGS: No evidence of acute fracture or dislocation. Well preserved joint
space. Well preserved bone mineral density. No intrinsic osseous
abnormality.

Included AP pelvis demonstrates a normal-appearing contralateral
left hip. Sacroiliac joints and symphysis pubis intact. Visualized
lower lumbar spine unremarkable.
IMPRESSION: Normal examination.
